# Patient Record
Sex: Male | Born: 1950 | Race: Black or African American | Hispanic: No | Marital: Single
Health system: Southern US, Community
[De-identification: ages and names within clinical notes are randomized; demographics above are authoritative.]

---

## 2005-05-07 ENCOUNTER — Ambulatory Visit: Payer: Self-pay | Admitting: Emergency Medicine

## 2013-03-20 ENCOUNTER — Emergency Department: Payer: Self-pay | Admitting: Emergency Medicine

## 2013-04-02 ENCOUNTER — Emergency Department: Payer: Self-pay | Admitting: Emergency Medicine

## 2014-08-22 ENCOUNTER — Emergency Department: Payer: Self-pay | Admitting: Emergency Medicine

## 2014-08-22 LAB — CBC
HCT: 29.3 % — AB (ref 40.0–52.0)
HGB: 9.1 g/dL — ABNORMAL LOW (ref 13.0–18.0)
MCH: 24.1 pg — ABNORMAL LOW (ref 26.0–34.0)
MCHC: 31.1 g/dL — AB (ref 32.0–36.0)
MCV: 78 fL — ABNORMAL LOW (ref 80–100)
Platelet: 460 10*3/uL — ABNORMAL HIGH (ref 150–440)
RBC: 3.78 10*6/uL — ABNORMAL LOW (ref 4.40–5.90)
RDW: 16.8 % — AB (ref 11.5–14.5)
WBC: 12.5 10*3/uL — AB (ref 3.8–10.6)

## 2014-08-22 LAB — COMPREHENSIVE METABOLIC PANEL
ALBUMIN: 2.2 g/dL — AB (ref 3.4–5.0)
Alkaline Phosphatase: 157 U/L — ABNORMAL HIGH
Anion Gap: 11 (ref 7–16)
BUN: 9 mg/dL (ref 7–18)
Bilirubin,Total: 0.5 mg/dL (ref 0.2–1.0)
CALCIUM: 9 mg/dL (ref 8.5–10.1)
Chloride: 103 mmol/L (ref 98–107)
Co2: 21 mmol/L (ref 21–32)
Creatinine: 1.01 mg/dL (ref 0.60–1.30)
EGFR (African American): >60
EGFR (Non-African Amer.): >60
GLUCOSE: 99 mg/dL (ref 65–99)
OSMOLALITY: 269 (ref 275–301)
Potassium: 2.9 mmol/L — ABNORMAL LOW (ref 3.5–5.1)
SGOT(AST): 20 U/L (ref 15–37)
SGPT (ALT): <6 U/L — ABNORMAL LOW
Sodium: 135 mmol/L — ABNORMAL LOW (ref 136–145)
Total Protein: 7.6 g/dL (ref 6.4–8.2)

## 2014-08-22 LAB — TROPONIN I: Troponin-I: <0.02 ng/mL

## 2014-08-27 LAB — CULTURE, BLOOD (SINGLE)

## 2014-08-30 ENCOUNTER — Ambulatory Visit: Payer: Self-pay | Admitting: Internal Medicine

## 2014-09-26 ENCOUNTER — Observation Stay: Payer: Self-pay | Admitting: Internal Medicine

## 2014-09-26 LAB — CBC
HCT: 28.2 % — ABNORMAL LOW (ref 40.0–52.0)
HGB: 8.8 g/dL — ABNORMAL LOW (ref 13.0–18.0)
MCH: 24.4 pg — ABNORMAL LOW (ref 26.0–34.0)
MCHC: 31 g/dL — ABNORMAL LOW (ref 32.0–36.0)
MCV: 79 fL — ABNORMAL LOW (ref 80–100)
PLATELETS: 462 10*3/uL — AB (ref 150–440)
RBC: 3.58 10*6/uL — ABNORMAL LOW (ref 4.40–5.90)
RDW: 17.3 % — ABNORMAL HIGH (ref 11.5–14.5)
WBC: 12.7 10*3/uL — ABNORMAL HIGH (ref 3.8–10.6)

## 2014-09-26 LAB — COMPREHENSIVE METABOLIC PANEL
Albumin: 1.8 g/dL — ABNORMAL LOW (ref 3.4–5.0)
Alkaline Phosphatase: 121 U/L — ABNORMAL HIGH (ref 46–116)
Anion Gap: 11 (ref 7–16)
BILIRUBIN TOTAL: 0.4 mg/dL (ref 0.2–1.0)
BUN: 8 mg/dL (ref 7–18)
Calcium, Total: 8.8 mg/dL (ref 8.5–10.1)
Chloride: 106 mmol/L (ref 98–107)
Co2: 24 mmol/L (ref 21–32)
Creatinine: 0.75 mg/dL (ref 0.60–1.30)
EGFR (African American): >60
EGFR (Non-African Amer.): >60
GLUCOSE: 79 mg/dL (ref 65–99)
Osmolality: 279 (ref 275–301)
Potassium: 2.9 mmol/L — ABNORMAL LOW (ref 3.5–5.1)
SGOT(AST): 17 U/L (ref 15–37)
Sodium: 141 mmol/L (ref 136–145)
Total Protein: 6.2 g/dL — ABNORMAL LOW (ref 6.4–8.2)

## 2014-09-26 LAB — MAGNESIUM: Magnesium: 1.6 mg/dL — ABNORMAL LOW

## 2014-09-26 LAB — TROPONIN I

## 2014-09-27 LAB — URINALYSIS, COMPLETE
BLOOD: NEGATIVE
Bacteria: NONE SEEN
Bilirubin,UR: NEGATIVE
Glucose,UR: NEGATIVE mg/dL (ref 0–75)
Ketone: NEGATIVE
Leukocyte Esterase: NEGATIVE
Nitrite: NEGATIVE
Ph: 5 (ref 4.5–8.0)
Protein: 30
RBC,UR: 2 /HPF (ref 0–5)
SPECIFIC GRAVITY: 1.019 (ref 1.003–1.030)
SQUAMOUS EPITHELIAL: NONE SEEN
WBC UR: 4 /HPF (ref 0–5)

## 2014-09-30 ENCOUNTER — Ambulatory Visit: Payer: Self-pay | Admitting: Internal Medicine

## 2014-10-29 DEATH — deceased

## 2014-12-29 NOTE — Discharge Summary (Signed)
PATIENT NAME:  Jacob Kane, Jacob Kane MR#:  [id number] DATE OF BIRTH:  [birth year]  DATE OF ADMISSION:  09/26/2014 DATE OF DISCHARGE:  09/27/2014  PRESENTING COMPLAINT: Weakness.   DISCHARGE DIAGNOSES: 1.  Hypokalemia.  2.  Anorexia with failure to thrive.  3.  Neoplasm on the lung which appears malignant with right-sided pleural effusion.  4.  Chronic pain.  5.  Anxiety.  6.  Emphysema with chronic obstructive pulmonary disease.   BRIEF SUMMARY OF HOSPITAL COURSE: [name] is 64 year old gentleman with history of lung cancer and home hospice service, came to the hospital as hospice nurse recommended for a gradual worsening in the status not eating too good, hypothermia and gradually getting weak.   The patient was admitted with:  1.  Hypokalemia; was repleted.  2.  Anorexia with failure to thrive. The patient was not well taken care of at home. He was seen by palliative care and discussion was made with family members with patient's brother and agreed to be DNR and agreed on going to hospice home for further management.  3.  Neoplasm on the lung, malignant with right-sided pleural effusion, no further interventions as per patient.  4.  Anxiety. Continue lorazepam.  5.  COPD. No active wheezing, continued his inhalers. The patient remained a no code, DNR.   TIME SPENT: Was 40 minutes.   DISCHARGE MEDICATIONS:  Were:  1.  Gabapentin 300 mg b.i.d.  2.  Tamsulosin 0.4 mg at bedtime.  3.  Lorazepam 0.5 mg every 8 hours as needed.  4.  Docusate 100 mg 1 capsule b.i.d. as needed.  5.  Melatonin 3 mg 1 tablet at bedtime.  6.  Morphine 20 mg/mL 0.25 to 0.5 mL every 1 to 2 hours as needed.   CODE STATUS: No code, do not resuscitate.    ____________________________ Wylie HailSona A. Allena KatzPatel, MD sap:nt D: 10/09/2014 14:33:06 ET T: 10/09/2014 22:36:38 ET JOB#: [id number]  cc: Myeisha Kruser A. Allena KatzPatel, MD, <Dictator> Willow OraSONA A Camilo Mander MD ELECTRONICALLY SIGNED 10/17/2014 23:18

## 2014-12-29 NOTE — H&P (Signed)
PATIENT NAME:  Jacob Kane, Jacob Kane MR#:  [id number] DATE OF BIRTH:  Nov 20, 1950  DATE OF ADMISSION:  09/26/2014  PRIMARY CARE PHYSICIAN: None.   REFERRING EMERGENCY ROOM PHYSICIAN:  Dr. Daryel November.    CHIEF COMPLAINT: Weakness.   HISTORY OF PRESENTING ILLNESS: A 64 year old male who has metastatic lung cancer and he is on home hospice services, his code status was full code. He was already set up on home oxygen and Meals on Wheels by hospice services. He lives at home alone, walks with a walker, but gradually not eating too good, his living conditions are not too good at home, and hospice has to provide him with heating, a portable heater at home. Today when the nurse visited him he appeared to be very weak, he was cold, and many of his meals appeared unopened and so as the patient's code status was full code she decided to transfer him to Emergency Room for further management. After coming to Emergency Room the patient had the initial workup done which showed some hypokalemia and overall the patient appeared very weak. Palliative consult was called in by emergency doctor and the patient was seen by Dr. Harvie Junior and finally the patient agreed to be DNR, but as he has poor living conditions and failure to thrive ER does not want to discharge him and maybe he might need some kind of placement. When I asked the patient he denies any complaint. He speaks very low, appears maybe depressed and he insists that he would like to go home at the end of his stay in the hospital rather than going somewhere else.   REVIEW OF SYSTEMS:    CONSTITUTIONAL: Negative for fever, fatigue.  Has positive generalized weakness and losing weight.  EYES: No blurring, double vision, discharge, or redness.  EARS, NOSE, THROAT: No tinnitus, ear pain, or hearing loss.  RESPIRATORY: No coughing, wheezing, hemoptysis, or shortness of breath.  CARDIOVASCULAR: No chest pain, orthopnea, edema, arrhythmia, palpitations.   GASTROINTESTINAL: No nausea, vomiting, diarrhea, abdominal pain.  GENITOURINARY: No dysuria, hematuria, or increased frequency.  ENDOCRINE: No heat or cold intolerance. No excessive sweating.  SKIN: No acne, rashes, or lesions.  MUSCULOSKELETAL: No pain or swelling in the joints.  NEUROLOGICAL: No numbness, weakness, but has generalized weakness. No tremor or rigidity.  PSYCHIATRIC: Denies any active problem.   PAST MEDICAL HISTORY:  1. Malignant neoplasm of right bronchus in lung.  2. Anorexia.  3. Emphysema.  4. Spinal stenosis in cervical region.   SOCIAL HISTORY: He was a smoker in the past, he stopped smoking 2013. Denies alcohol or illegal drug use. Lives home alone, walks with a walker and on home hospice services.   FAMILY HISTORY: He denies any significant history of diabetes or cancer in the family.   MEDICATIONS: As per hospice records.   1. Tramadol 50 mg oral tablet every 6 hours as needed for pain.  2. Tamsulosin 0.4 mg oral once a day.  3. Spiriva 18 mcg inhalation once a day.  4. Senna 8.6 mg oral tablet once a day.  5. Omeprazole 20 mg oral once a day.  6. Melatonin 3 mg oral once a day.  7. Magnesium oxide 400 mg oral once a day.  8. Lorazepam 0.5 mg oral every 8 hours as needed for anxiety.  9. Levofloxacin 750 mg oral once a day.  10. Ipratropium  inhalation every 6 hours as needed for shortness of breath.   11. Gabapentin 300 mg oral 2 times a day.  12. Docusate 100 mg 2 times a day.  13. Amoxicillin and clavulanate 875 mg tablet every 12 hours.   PHYSICAL EXAMINATION:  VITAL SIGNS: In ER temperature 97.4, pulse is 91, respirations 30, blood pressure 105/74, and pulse oximetry is 100 on 2 liters oxygen.  GENERAL: The patient is fully alert and oriented, he appears severely cachectic.  HEENT: Head and neck atraumatic. Conjunctivae pink. Oral mucosa moist.  NECK: Supple. No JVD.  RESPIRATORY: Decreased air entry on the right side. There is no crepitation or  wheezing. CARDIOVASCULAR: S1, S2 present, regular. No murmur appreciated.  ABDOMEN: Severely cachectic, bowel sounds present. No tenderness. No organomegaly felt.  SKIN: No acne, rashes, or lesions.  MUSCULOSKELETAL: He has severe muscle wasting present to lower limbs. No tenderness or swelling in any joints.  LEGS: No edema.  NEUROLOGICAL: Power 4 out of 5, moves all 4 limbs, appears generalized weak. No tremor or rigidity. Sensation is intact.  PSYCHIATRIC: Appears very slow, but unable to assess about any other psychiatric problem at this time.   LABORATORY RESULTS:   1.  Glucose 79, BUN 8, creatinine 0.75, sodium 141, potassium is 2.9, chloride 106, CO2 of 24, calcium is 8.8.  2.  Total protein is 6.2, albumin 1.8, bilirubin 0.4, alkaline phosphate 121, SGOT 17, SGPT less than 6.  3.  Troponin less than 0.2.  4.  WBC 12.7, hemoglobin 8.8, platelet count is 462,000, and MCV is 79.  5.  Chest x-ray PA and lateral is done which shows there is almost complete opacification of right hemithorax due to obstructing right hilar mass and subsequent consolidation, there is air fluid level in right upper lobe probably due to large infected emphysematous bulla, left lung appears clear.   ASSESSMENT AND PLAN:  A 64 year old male with a past history of lung cancer and on home hospice services, came to hospital as the hospice nurse decided to send him to Emergency Room because of gradual worsening in the status, not eating too good, hypothermia, and getting gradually weak and his status was full code.   1.  Hypokalemia. We will replace orally and recheck tomorrow. Check magnesium.  2.  Anorexia and failure to thrive. As per hospice nurse there were many opened his meal dishes present at home supplied by his Meals on Wheels, with failure to thrive, now on hospice services. The patient agreed to be DNR.  Maybe he is a candidate for hospice home. I would like to leave it up to palliative care tomorrow to discuss  with the patient.  3.  Neoplasm on the lung and malignant with right side pleural effusion. Continue oxygen as needed and no further intervention as the patient is DNR and is on hospice.  4.  Pain.  We will give Percocet and tramadol as needed for his pain due to cancer. 5.  Anxiety. We will continue lorazepam as needed.  6.  Emphysema and chronic obstructive pulmonary disease. Currently there is no active wheezing. We will continue his Spiriva.    CODE STATUS: DNR.    TOTAL TIME SPENT ON THIS ADMISSION: 50 minutes.      ____________________________ Hope PigeonVaibhavkumar G. Elisabeth PigeonVachhani, MD vgv:bu D: 09/26/2014 19:28:37 ET T: 09/26/2014 19:46:23 ET JOB#: [id number]  cc: Hope PigeonVaibhavkumar G. Elisabeth PigeonVachhani, MD, <Dictator> Altamese DillingVAIBHAVKUMAR Lera Gaines MD ELECTRONICALLY SIGNED 10/14/2014 9:58

## 2016-01-21 IMAGING — CT CT CHEST W/ CM
2 of 3 series · 14 of 36 positions shown, 17 images · IV contrast (isovue)
Comparison: 08/22/2014 chest radiograph

CLINICAL DATA: Lung cancer.  Cough for 1 month.

EXAM:
CT CHEST WITH CONTRAST
TECHNIQUE: Multidetector CT imaging of the chest was performed during
intravenous contrast administration.
CONTRAST:  75 cc Isovue 300

[Series 5: cor routine chest with · coronal · 0.72mm/px · 3 of 114 slices shown]
[im 23/114  lung]
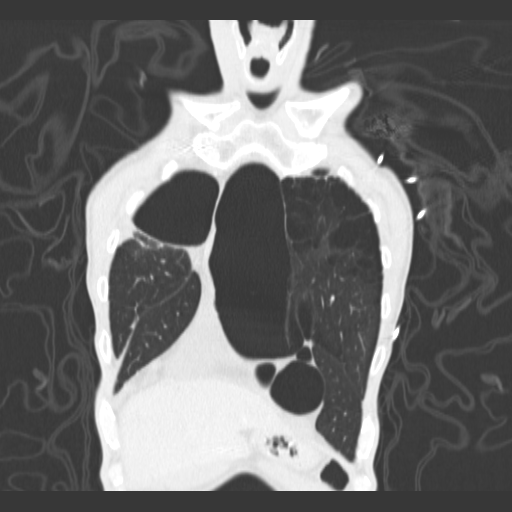
[im 46/114  lung]
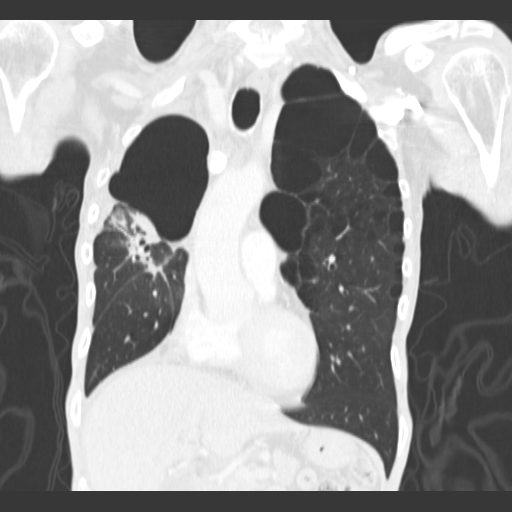
[im 68/114  lung]
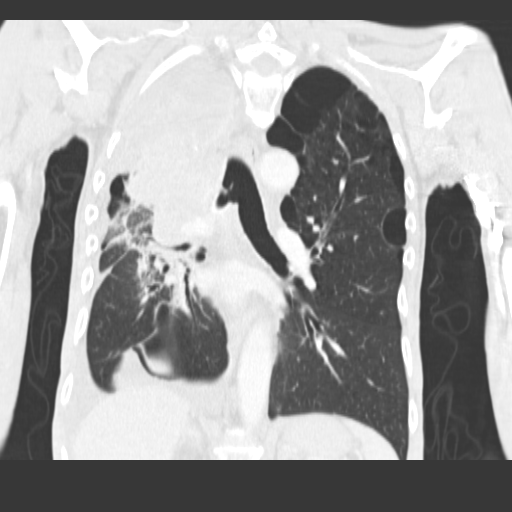

[Series 7: routine chest with 2 · axial · 0.57mm/px · z∈[+464,+748]mm · 11 of 69 slices shown, 14 images]
[im 6/69  mediastinal]
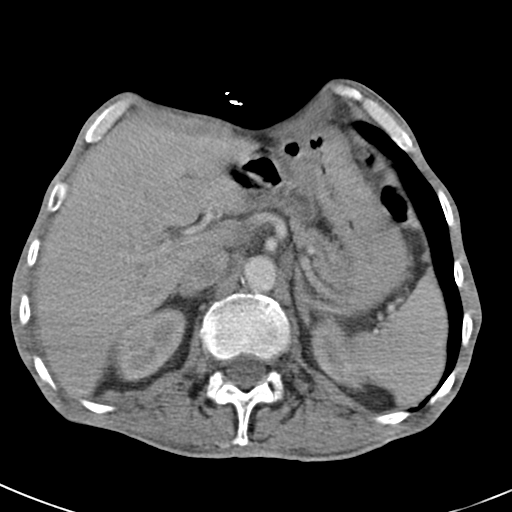
[im 6/69  lung]
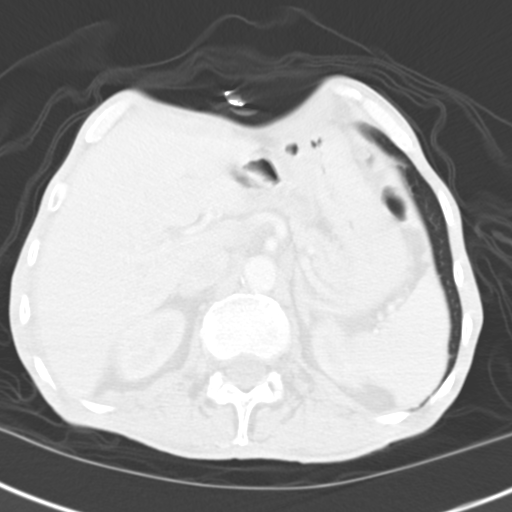
[im 11/69  lung]
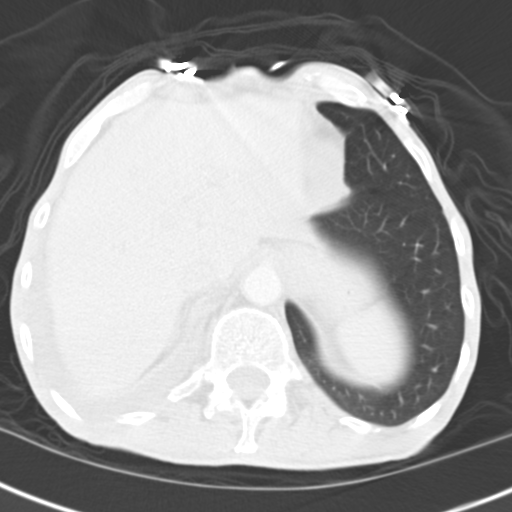
[im 16/69  lung]
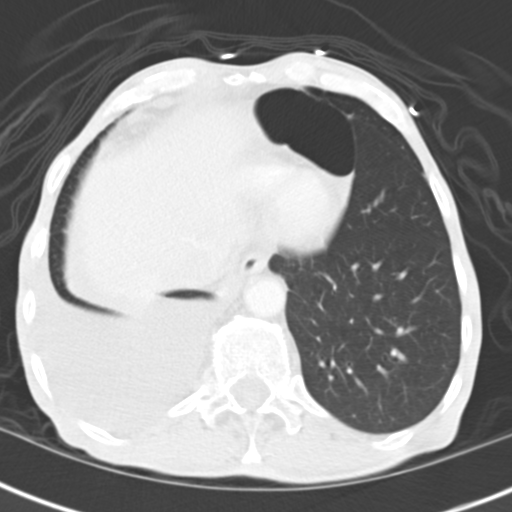
[im 23/69  lung]
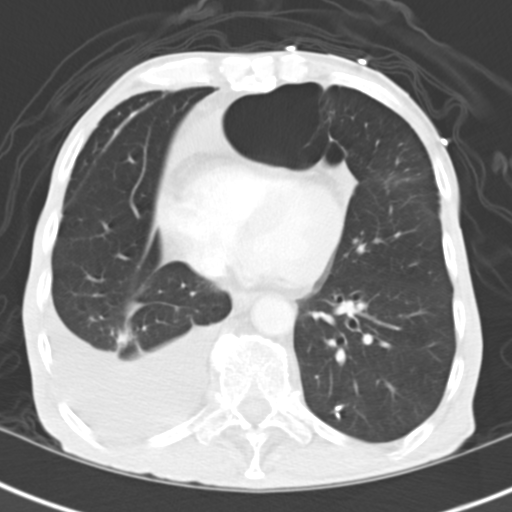
[im 28/69  mediastinal]
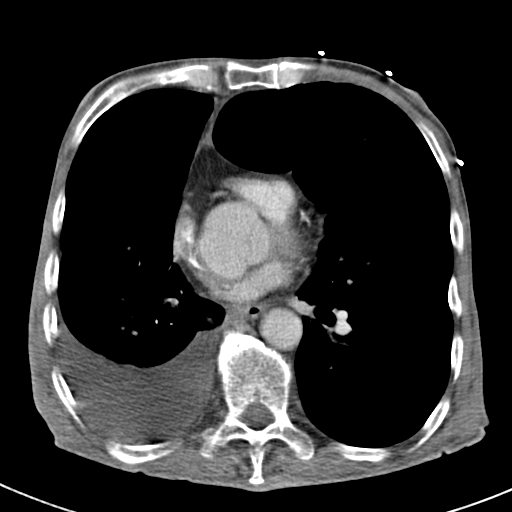
[im 28/69  lung]
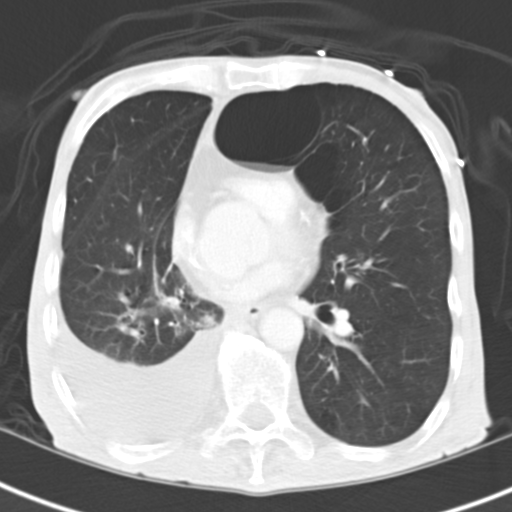
[im 36/69  lung]
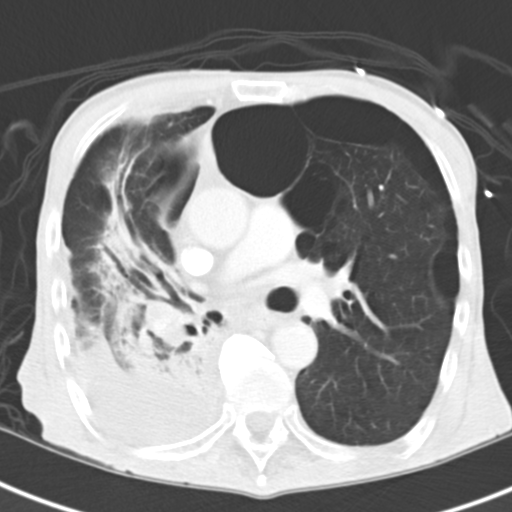
[im 41/69  lung]
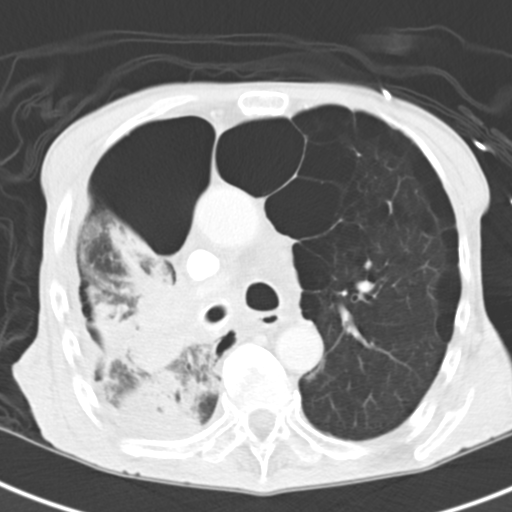
[im 46/69  lung]
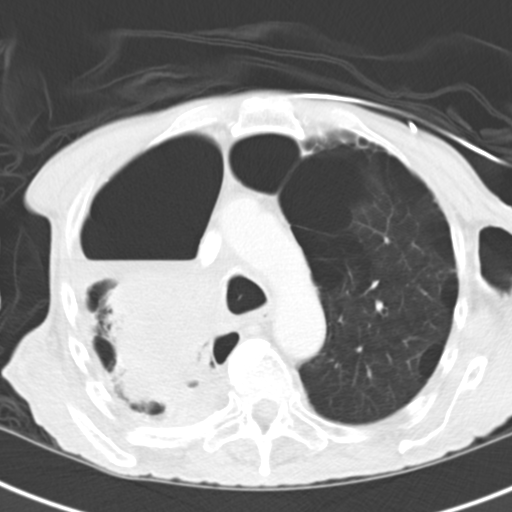
[im 53/69  mediastinal]
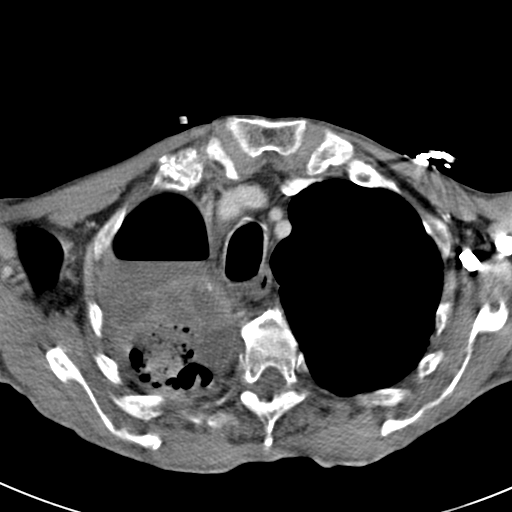
[im 53/69  lung]
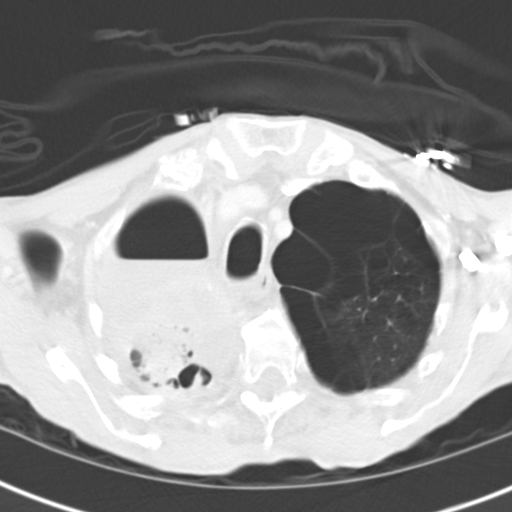
[im 58/69  lung]
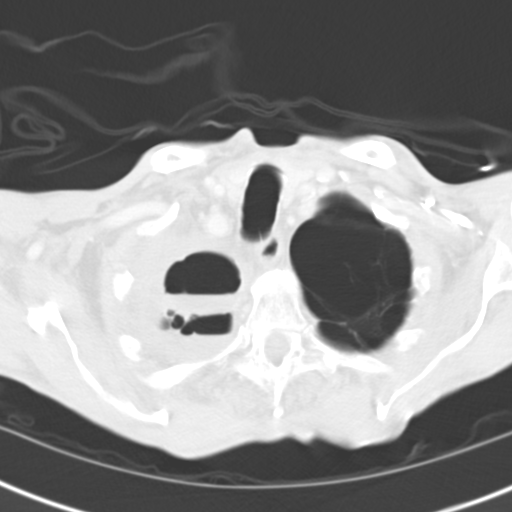
[im 63/69  lung]
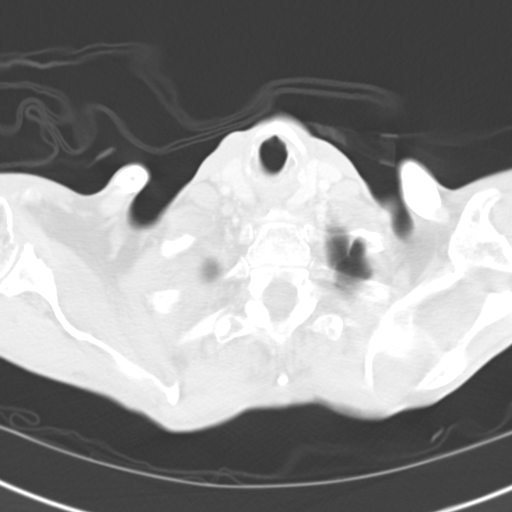

[14 of 36 positions shown; findings below may reference images not displayed]

FINDINGS: A suspected right suprahilar mass may measure up to 7.4 cm in long
axis. This extends to the right hilum where it is confluent with
hilar adenopathy. The mass appears to occlude the right upper lobe
pulmonary artery and significantly extrinsically narrow the right
lower lobe pulmonary artery. Invasion of the pulmonary artery and
tumor thrombus in the pulmonary artery is certainly not excluded
based on this complex appearance.

Although of most of the superior vena cava appears relatively
normal, there does appear to be some narrowing at the cavoatrial
junction for example on images 36-44 of series 7 and image 59 of
series 5, which could conceivably be due to thrombus or tumor.

The right upper lobe bronchus is completely occluded at its origin.
The bronchus intermedius and central lower lobe and middle lobe
bronchi appear grossly patent

Air-fluid levels are present in cyst like lesions at the right lung
apex, possibly in bulla and less likely to be cavitary portions of
the cancer. In combination with the mass and the moderate right
pleural effusion, well under half of the available right lung is
aerated. There is atelectasis in significant portions of the right
lower lobe and right middle lobe with peribronchovascular airspace
opacity.

There is suspected tumor nearly at the carina and anterior to the
bifurcation in the mediastinum. Suspected pleural based mass along
the right upper chest posteriorly on image 22 of series 7,
accordingly the right pleural effusion is probably malignant.

Small right supraclavicular lymph nodes are present. No
cardiomegaly. Pericardial effusion noted. Nodularity in the
epicardial adipose tissue on images 50-57 of series 7.

I do not observe an adrenal mass. Several small hepatic hypodense
lesions in segment 2 and for, technically nonspecific.

Severe emphysema especially paraseptal distribution with large
paraseptal bulla in the left lung. Linear calcifications in the left
lower lobe. A is old healed left clavicular fracture. Thoracic
spondylosis.
IMPRESSION: 1. Large right upper lobe mass occluding the right upper lobe
bronchus, extending adjacent to the carina, and invading the right
hilum and mediastinum, with occlusion of the right upper lobe
pulmonary artery and extrinsic narrowing of the right lower lobe
pulmonary artery. Suspected malignant pleural effusion. Malignancy
in the pericardial effusion is indeterminate. Assuming non-small
cell carcinoma, this represents T4 N2 M1a disease (stage IV).
2. Multiple air-fluid levels in right upper lobe all left, probably
reflecting infected bulla. Patchy airspace opacities in the right
lower lobe and right middle lobe, especially with a
peribronchovascular distribution, suspicious for pneumonia or
pulmonary hemorrhage.
3. Severe emphysema.
4. Narrowing of the contrast column at the cavoatrial junction could
be due to thrombus or tumor along the right side of the cavoatrial
junction.
5. 2 hypodense lesions in the liver are technically nonspecific
although probably cysts based on their low-density.

## 2016-01-21 IMAGING — CR DG CHEST 2V
1 series · 4 of 4 positions shown · non-contrast
Comparison: None.

CLINICAL DATA: Generalized weakness.  Reported lung carcinoma

EXAM:
CHEST  2 VIEW

[Series 1: dxr chest pa (or ap) and lateral · 0.14mm/px · 4 of 4 slices shown]
[im 1/4]
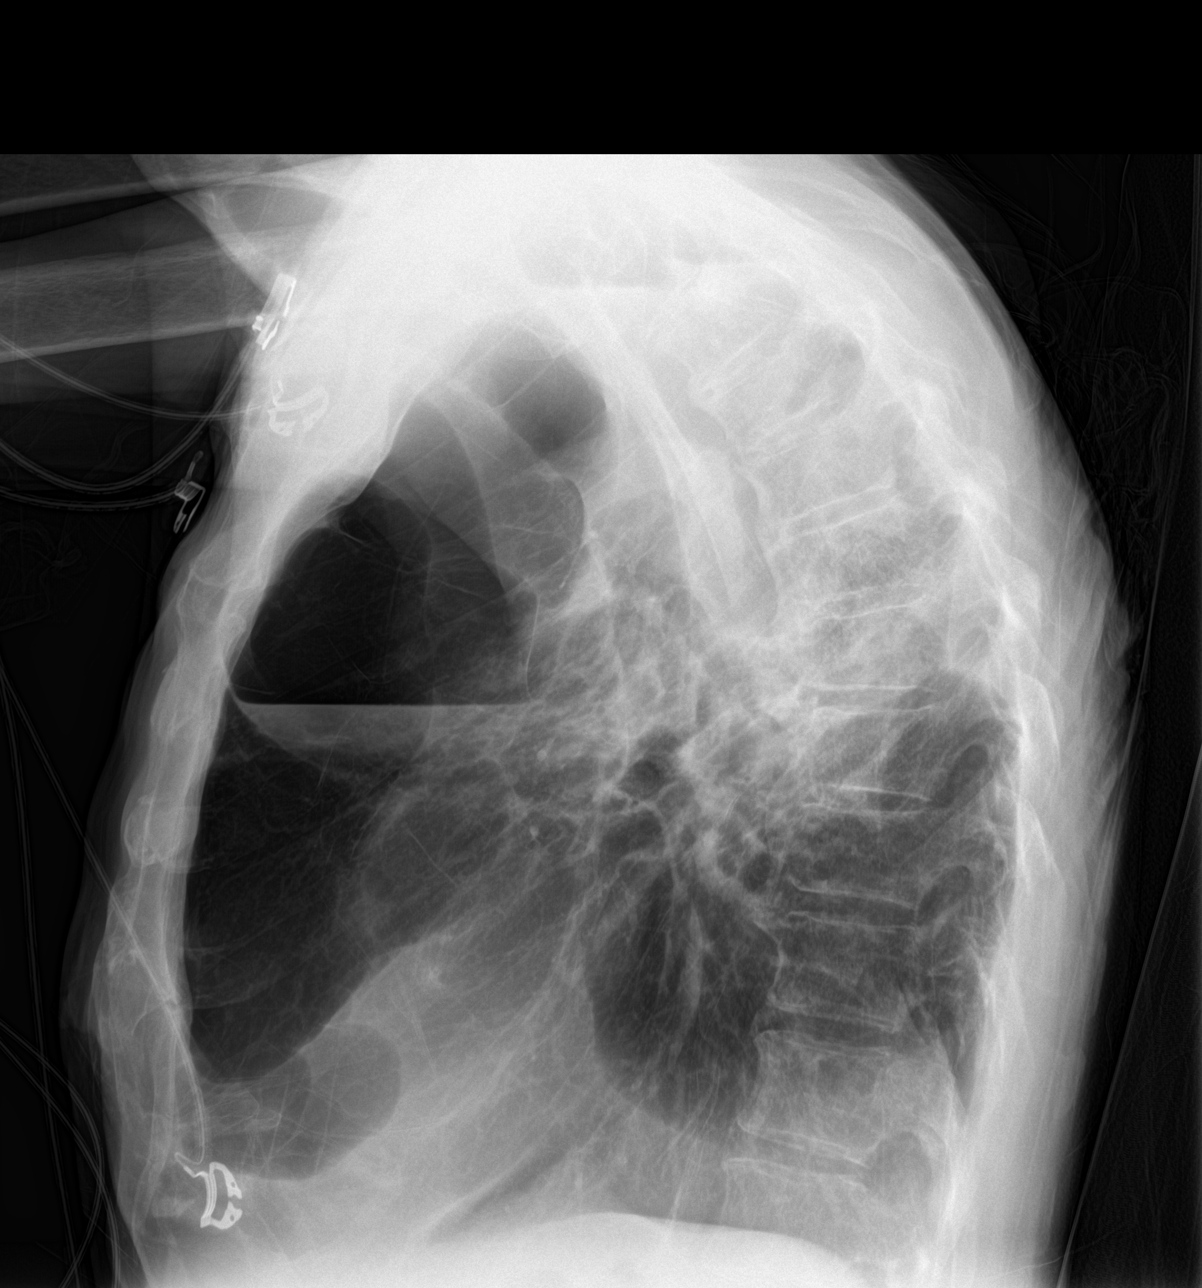
[im 2/4]
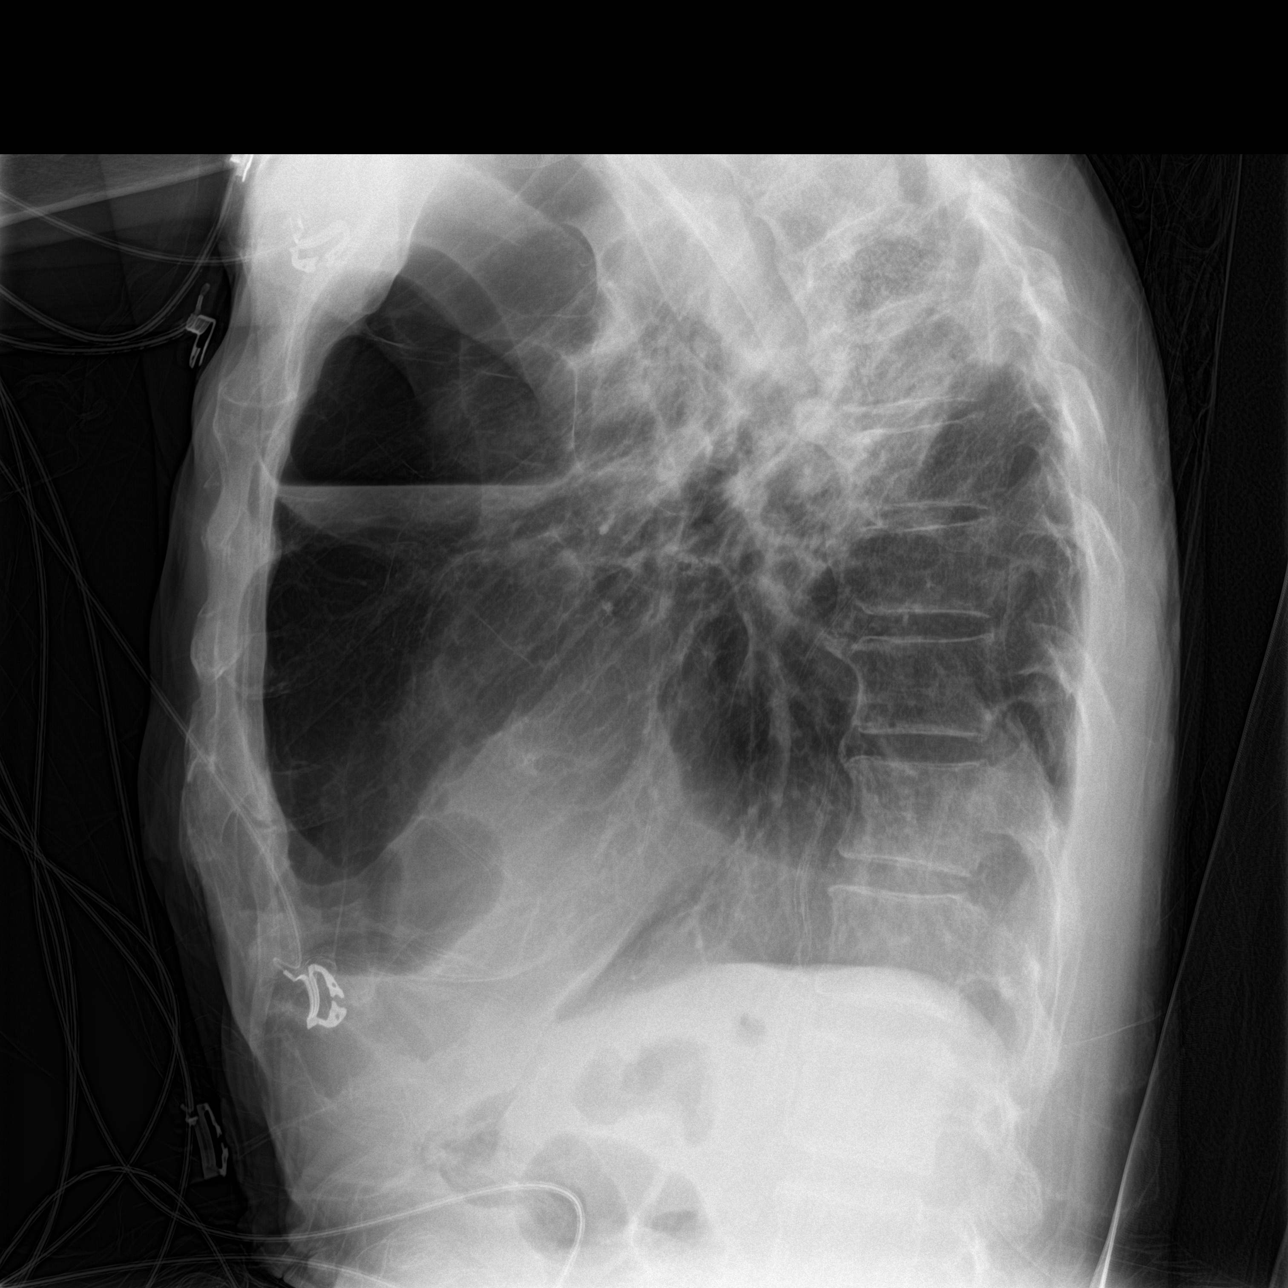
[im 3/4]
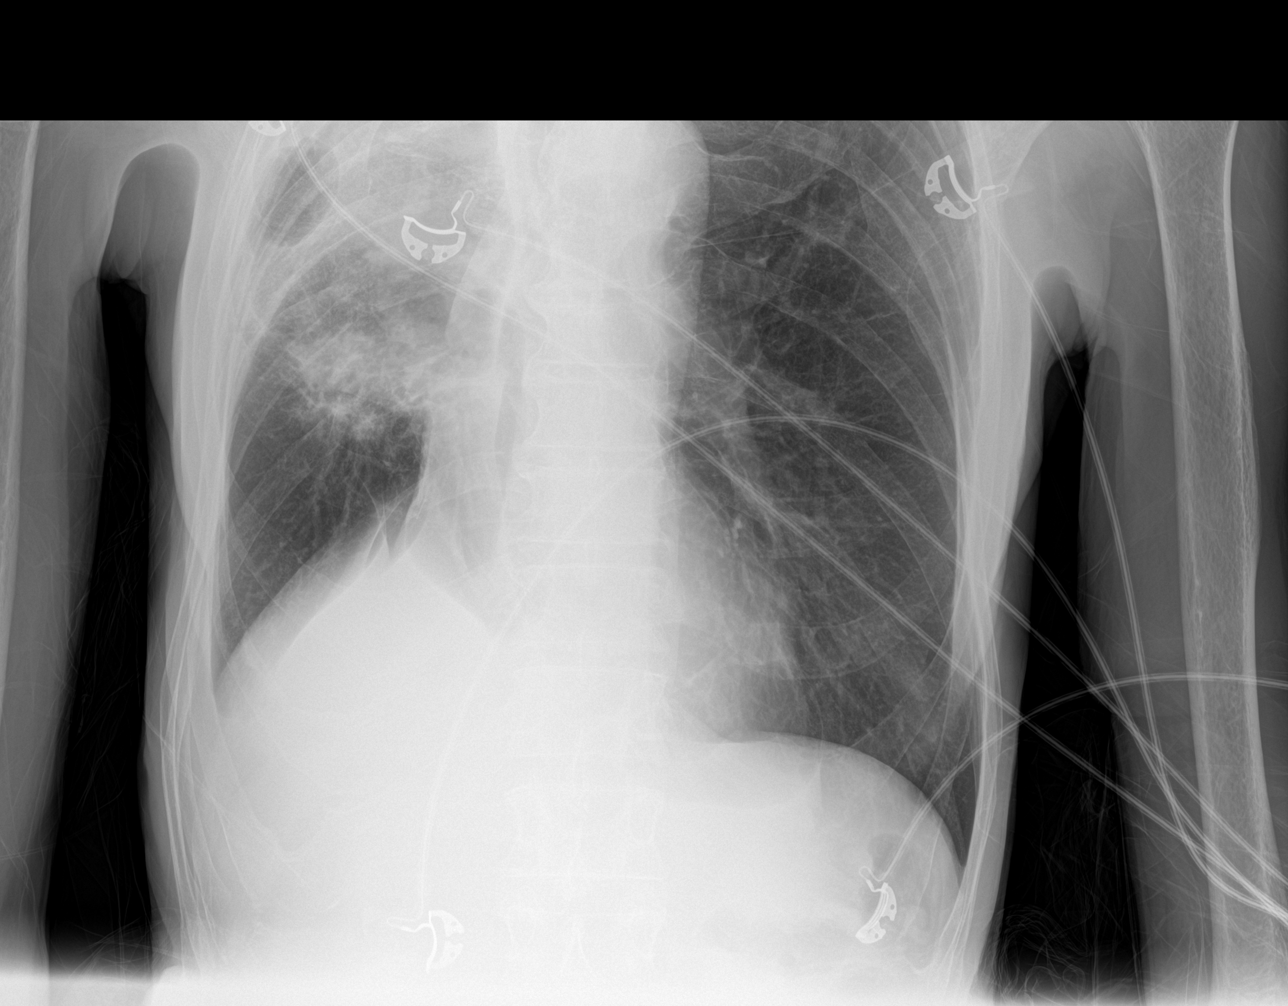
[im 4/4]
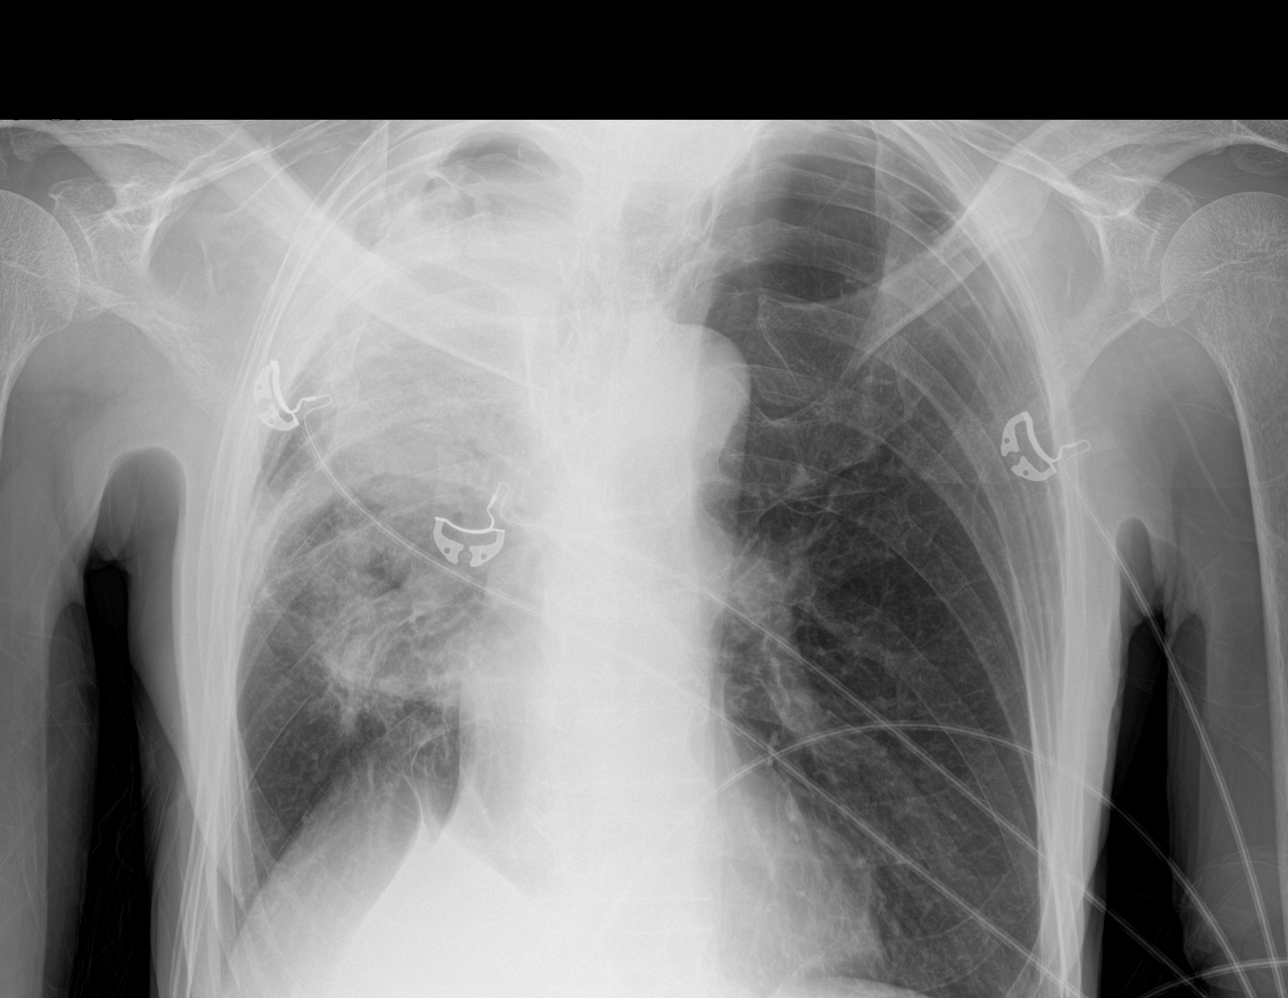

[4 of 4 positions shown; findings below may reference images not displayed]

FINDINGS: There is is a volume loss on the right. There is underlying
emphysema with multiple bullae, better seen on the lateral view.
There is extensive consolidation through the right lung. These
changes are most severe in the posterior segment of the right upper
lobe. A well-defined mass is not seen on this study. The degree of
consolidation on the right could obscure a mass.

Heart size is within normal limits. Pulmonary vascularity reflects
underlying emphysema. No adenopathy is appreciable. No bone lesions.
IMPRESSION: Extensive underlying emphysema. Extensive consolidation on the right
with volume loss. Question acute pneumonia versus opacification
related to reported lung carcinoma. Both entities may well exist
concurrently. As there are no prior studies to compare, chest CT,
ideally with intravenous contrast, is advised to further assess.
